# Patient Record
Sex: Female | Born: 1935 | Marital: Single | Smoking: Never smoker
Health system: Southern US, Community
[De-identification: ages and names within clinical notes are randomized; demographics above are authoritative.]

## PROBLEM LIST (undated history)

## (undated) DIAGNOSIS — E785 Hyperlipidemia, unspecified: Secondary | ICD-10-CM

## (undated) DIAGNOSIS — F0390 Unspecified dementia without behavioral disturbance: Secondary | ICD-10-CM

## (undated) DIAGNOSIS — I671 Cerebral aneurysm, nonruptured: Secondary | ICD-10-CM

---

## 1998-05-15 ENCOUNTER — Other Ambulatory Visit: Admission: RE | Admit: 1998-05-15 | Discharge: 1998-05-15 | Payer: Self-pay | Admitting: Gynecology

## 1999-06-25 ENCOUNTER — Other Ambulatory Visit: Admission: RE | Admit: 1999-06-25 | Discharge: 1999-06-25 | Payer: Self-pay | Admitting: Gynecology

## 2000-09-01 ENCOUNTER — Other Ambulatory Visit: Admission: RE | Admit: 2000-09-01 | Discharge: 2000-09-01 | Payer: Self-pay | Admitting: Gynecology

## 2015-06-28 DIAGNOSIS — I10 Essential (primary) hypertension: Secondary | ICD-10-CM | POA: Diagnosis not present

## 2015-07-04 DIAGNOSIS — Z79899 Other long term (current) drug therapy: Secondary | ICD-10-CM | POA: Diagnosis not present

## 2015-07-04 DIAGNOSIS — Z0000 Encounter for general adult medical examination without abnormal findings: Secondary | ICD-10-CM | POA: Diagnosis not present

## 2015-07-04 DIAGNOSIS — E7800 Pure hypercholesterolemia, unspecified: Secondary | ICD-10-CM | POA: Diagnosis not present

## 2016-01-08 DIAGNOSIS — Z79899 Other long term (current) drug therapy: Secondary | ICD-10-CM | POA: Diagnosis not present

## 2016-01-08 DIAGNOSIS — Z1389 Encounter for screening for other disorder: Secondary | ICD-10-CM | POA: Diagnosis not present

## 2016-01-08 DIAGNOSIS — M810 Age-related osteoporosis without current pathological fracture: Secondary | ICD-10-CM | POA: Diagnosis not present

## 2016-01-08 DIAGNOSIS — Z9181 History of falling: Secondary | ICD-10-CM | POA: Diagnosis not present

## 2016-01-08 DIAGNOSIS — E785 Hyperlipidemia, unspecified: Secondary | ICD-10-CM | POA: Diagnosis not present

## 2016-05-20 DIAGNOSIS — M25512 Pain in left shoulder: Secondary | ICD-10-CM | POA: Diagnosis not present

## 2016-05-28 DIAGNOSIS — Z0000 Encounter for general adult medical examination without abnormal findings: Secondary | ICD-10-CM | POA: Diagnosis not present

## 2016-05-28 DIAGNOSIS — M818 Other osteoporosis without current pathological fracture: Secondary | ICD-10-CM | POA: Diagnosis not present

## 2016-05-28 DIAGNOSIS — E7800 Pure hypercholesterolemia, unspecified: Secondary | ICD-10-CM | POA: Diagnosis not present

## 2016-06-10 DIAGNOSIS — Z0000 Encounter for general adult medical examination without abnormal findings: Secondary | ICD-10-CM | POA: Diagnosis not present

## 2016-06-10 DIAGNOSIS — Z23 Encounter for immunization: Secondary | ICD-10-CM | POA: Diagnosis not present

## 2016-07-09 DIAGNOSIS — M810 Age-related osteoporosis without current pathological fracture: Secondary | ICD-10-CM | POA: Diagnosis not present

## 2016-07-09 DIAGNOSIS — Z0000 Encounter for general adult medical examination without abnormal findings: Secondary | ICD-10-CM | POA: Diagnosis not present

## 2016-07-09 DIAGNOSIS — Z79899 Other long term (current) drug therapy: Secondary | ICD-10-CM | POA: Diagnosis not present

## 2016-07-09 DIAGNOSIS — R413 Other amnesia: Secondary | ICD-10-CM | POA: Diagnosis not present

## 2016-07-09 DIAGNOSIS — E785 Hyperlipidemia, unspecified: Secondary | ICD-10-CM | POA: Diagnosis not present

## 2016-07-26 DIAGNOSIS — M25532 Pain in left wrist: Secondary | ICD-10-CM | POA: Diagnosis not present

## 2016-11-28 DIAGNOSIS — L209 Atopic dermatitis, unspecified: Secondary | ICD-10-CM | POA: Diagnosis not present

## 2016-11-28 DIAGNOSIS — G3184 Mild cognitive impairment, so stated: Secondary | ICD-10-CM | POA: Diagnosis not present

## 2016-11-28 DIAGNOSIS — Z681 Body mass index (BMI) 19 or less, adult: Secondary | ICD-10-CM | POA: Diagnosis not present

## 2016-11-28 DIAGNOSIS — E785 Hyperlipidemia, unspecified: Secondary | ICD-10-CM | POA: Diagnosis not present

## 2016-11-28 DIAGNOSIS — M810 Age-related osteoporosis without current pathological fracture: Secondary | ICD-10-CM | POA: Diagnosis not present

## 2016-11-28 DIAGNOSIS — H918X9 Other specified hearing loss, unspecified ear: Secondary | ICD-10-CM | POA: Diagnosis not present

## 2016-11-28 DIAGNOSIS — R030 Elevated blood-pressure reading, without diagnosis of hypertension: Secondary | ICD-10-CM | POA: Diagnosis not present

## 2016-11-28 DIAGNOSIS — Z0000 Encounter for general adult medical examination without abnormal findings: Secondary | ICD-10-CM | POA: Diagnosis not present

## 2016-11-28 DIAGNOSIS — H9311 Tinnitus, right ear: Secondary | ICD-10-CM | POA: Diagnosis not present

## 2016-11-28 DIAGNOSIS — R69 Illness, unspecified: Secondary | ICD-10-CM | POA: Diagnosis not present

## 2017-01-08 DIAGNOSIS — Z681 Body mass index (BMI) 19 or less, adult: Secondary | ICD-10-CM | POA: Diagnosis not present

## 2017-01-08 DIAGNOSIS — M859 Disorder of bone density and structure, unspecified: Secondary | ICD-10-CM | POA: Diagnosis not present

## 2017-01-08 DIAGNOSIS — Z13820 Encounter for screening for osteoporosis: Secondary | ICD-10-CM | POA: Diagnosis not present

## 2017-01-08 DIAGNOSIS — M810 Age-related osteoporosis without current pathological fracture: Secondary | ICD-10-CM | POA: Diagnosis not present

## 2017-01-08 DIAGNOSIS — R413 Other amnesia: Secondary | ICD-10-CM | POA: Diagnosis not present

## 2017-01-08 DIAGNOSIS — E785 Hyperlipidemia, unspecified: Secondary | ICD-10-CM | POA: Diagnosis not present

## 2017-03-16 DIAGNOSIS — L299 Pruritus, unspecified: Secondary | ICD-10-CM | POA: Diagnosis not present

## 2017-03-16 DIAGNOSIS — L209 Atopic dermatitis, unspecified: Secondary | ICD-10-CM | POA: Diagnosis not present

## 2017-06-18 DIAGNOSIS — Z23 Encounter for immunization: Secondary | ICD-10-CM | POA: Diagnosis not present

## 2017-07-13 DIAGNOSIS — Z1331 Encounter for screening for depression: Secondary | ICD-10-CM | POA: Diagnosis not present

## 2017-07-13 DIAGNOSIS — Z1339 Encounter for screening examination for other mental health and behavioral disorders: Secondary | ICD-10-CM | POA: Diagnosis not present

## 2017-07-13 DIAGNOSIS — Z9181 History of falling: Secondary | ICD-10-CM | POA: Diagnosis not present

## 2017-07-13 DIAGNOSIS — R413 Other amnesia: Secondary | ICD-10-CM | POA: Diagnosis not present

## 2017-07-13 DIAGNOSIS — Z681 Body mass index (BMI) 19 or less, adult: Secondary | ICD-10-CM | POA: Diagnosis not present

## 2017-07-13 DIAGNOSIS — Z0000 Encounter for general adult medical examination without abnormal findings: Secondary | ICD-10-CM | POA: Diagnosis not present

## 2018-01-12 DIAGNOSIS — R413 Other amnesia: Secondary | ICD-10-CM | POA: Diagnosis not present

## 2018-01-12 DIAGNOSIS — Z79899 Other long term (current) drug therapy: Secondary | ICD-10-CM | POA: Diagnosis not present

## 2018-01-12 DIAGNOSIS — E785 Hyperlipidemia, unspecified: Secondary | ICD-10-CM | POA: Diagnosis not present

## 2018-01-12 DIAGNOSIS — Z681 Body mass index (BMI) 19 or less, adult: Secondary | ICD-10-CM | POA: Diagnosis not present

## 2018-02-22 DIAGNOSIS — H9192 Unspecified hearing loss, left ear: Secondary | ICD-10-CM | POA: Diagnosis not present

## 2018-02-22 DIAGNOSIS — M1990 Unspecified osteoarthritis, unspecified site: Secondary | ICD-10-CM | POA: Diagnosis not present

## 2018-02-22 DIAGNOSIS — E785 Hyperlipidemia, unspecified: Secondary | ICD-10-CM | POA: Diagnosis not present

## 2018-02-22 DIAGNOSIS — R69 Illness, unspecified: Secondary | ICD-10-CM | POA: Diagnosis not present

## 2018-02-22 DIAGNOSIS — R636 Underweight: Secondary | ICD-10-CM | POA: Diagnosis not present

## 2018-02-22 DIAGNOSIS — M810 Age-related osteoporosis without current pathological fracture: Secondary | ICD-10-CM | POA: Diagnosis not present

## 2018-02-22 DIAGNOSIS — R64 Cachexia: Secondary | ICD-10-CM | POA: Diagnosis not present

## 2018-02-22 DIAGNOSIS — Z681 Body mass index (BMI) 19 or less, adult: Secondary | ICD-10-CM | POA: Diagnosis not present

## 2018-02-22 DIAGNOSIS — Z7983 Long term (current) use of bisphosphonates: Secondary | ICD-10-CM | POA: Diagnosis not present

## 2018-05-07 DIAGNOSIS — Z681 Body mass index (BMI) 19 or less, adult: Secondary | ICD-10-CM | POA: Diagnosis not present

## 2018-05-07 DIAGNOSIS — N3000 Acute cystitis without hematuria: Secondary | ICD-10-CM | POA: Diagnosis not present

## 2018-05-10 DIAGNOSIS — Z681 Body mass index (BMI) 19 or less, adult: Secondary | ICD-10-CM | POA: Diagnosis not present

## 2018-05-10 DIAGNOSIS — R69 Illness, unspecified: Secondary | ICD-10-CM | POA: Diagnosis not present

## 2018-05-10 DIAGNOSIS — R413 Other amnesia: Secondary | ICD-10-CM | POA: Diagnosis not present

## 2018-05-13 ENCOUNTER — Emergency Department (HOSPITAL_COMMUNITY)
Admission: EM | Admit: 2018-05-13 | Discharge: 2018-05-13 | Disposition: A | Discharge status: Home / Self Care | Payer: Medicare HMO | Attending: Emergency Medicine | Admitting: Emergency Medicine

## 2018-05-13 ENCOUNTER — Encounter (HOSPITAL_COMMUNITY): Payer: Self-pay

## 2018-05-13 ENCOUNTER — Other Ambulatory Visit: Payer: Self-pay

## 2018-05-13 ENCOUNTER — Emergency Department (HOSPITAL_COMMUNITY): Payer: Medicare HMO

## 2018-05-13 DIAGNOSIS — R4182 Altered mental status, unspecified: Secondary | ICD-10-CM | POA: Diagnosis not present

## 2018-05-13 DIAGNOSIS — Z79899 Other long term (current) drug therapy: Secondary | ICD-10-CM | POA: Diagnosis not present

## 2018-05-13 DIAGNOSIS — R69 Illness, unspecified: Secondary | ICD-10-CM | POA: Diagnosis not present

## 2018-05-13 DIAGNOSIS — F0390 Unspecified dementia without behavioral disturbance: Secondary | ICD-10-CM | POA: Insufficient documentation

## 2018-05-13 DIAGNOSIS — R451 Restlessness and agitation: Secondary | ICD-10-CM | POA: Insufficient documentation

## 2018-05-13 HISTORY — DX: Hyperlipidemia, unspecified: E78.5

## 2018-05-13 HISTORY — DX: Unspecified dementia, unspecified severity, without behavioral disturbance, psychotic disturbance, mood disturbance, and anxiety: F03.90

## 2018-05-13 HISTORY — DX: Cerebral aneurysm, nonruptured: I67.1

## 2018-05-13 LAB — COMPREHENSIVE METABOLIC PANEL
ALBUMIN: 3.5 g/dL (ref 3.5–5.0)
ALT: 18 U/L (ref 0–44)
AST: 22 U/L (ref 15–41)
Alkaline Phosphatase: 50 U/L (ref 38–126)
Anion gap: 6 (ref 5–15)
BUN: 26 mg/dL — AB (ref 8–23)
CHLORIDE: 109 mmol/L (ref 98–111)
CO2: 28 mmol/L (ref 22–32)
CREATININE: 0.61 mg/dL (ref 0.44–1.00)
Calcium: 9.1 mg/dL (ref 8.9–10.3)
GFR calc Af Amer: >60 mL/min (ref >60)
GFR calc non Af Amer: >60 mL/min (ref >60)
Glucose, Bld: 102 mg/dL — ABNORMAL HIGH (ref 70–99)
Potassium: 3.6 mmol/L (ref 3.5–5.1)
SODIUM: 143 mmol/L (ref 135–145)
Total Bilirubin: 0.7 mg/dL (ref 0.3–1.2)
Total Protein: 6.5 g/dL (ref 6.5–8.1)

## 2018-05-13 LAB — CBC
HCT: 39 % (ref 36.0–46.0)
Hemoglobin: 12.4 g/dL (ref 12.0–15.0)
MCH: 30.5 pg (ref 26.0–34.0)
MCHC: 31.8 g/dL (ref 30.0–36.0)
MCV: 95.8 fL (ref 78.0–100.0)
PLATELETS: 227 10*3/uL (ref 150–400)
RBC: 4.07 MIL/uL (ref 3.87–5.11)
RDW: 14 % (ref 11.5–15.5)
WBC: 8.4 10*3/uL (ref 4.0–10.5)

## 2018-05-13 MED ORDER — LORAZEPAM 1 MG PO TABS
0.5000 mg | ORAL_TABLET | Freq: Two times a day (BID) | ORAL | Status: DC | PRN
  Administered 2018-05-13: 0.5 mg via ORAL
  Filled 2018-05-13: qty 1

## 2018-05-13 MED ORDER — MEMANTINE HCL-DONEPEZIL HCL ER 28-10 MG PO CP24: 1.0000 | ORAL_CAPSULE | Freq: Every day | ORAL | Status: DC

## 2018-05-13 MED ORDER — LORAZEPAM 0.5 MG PO TABS: 0.5000 mg | ORAL_TABLET | Freq: Two times a day (BID) | ORAL | 0 refills | Status: AC | PRN

## 2018-05-13 MED ORDER — DIVALPROEX SODIUM 125 MG PO DR TAB: 125.0000 mg | DELAYED_RELEASE_TABLET | Freq: Two times a day (BID) | ORAL | 0 refills | Status: AC

## 2018-05-13 MED ORDER — DIVALPROEX SODIUM 125 MG PO DR TAB
125.0000 mg | DELAYED_RELEASE_TABLET | Freq: Two times a day (BID) | ORAL | Status: DC
  Administered 2018-05-13: 125 mg via ORAL
  Filled 2018-05-13 (×2): qty 1

## 2018-05-13 NOTE — ED Triage Notes (Signed)
Pt here with family with hx of dementia. Pt has been running away from home by climbing out of the window the past few days. Pt was found by husband 2 miles from home. Pt was recently treated for UTI and placed on risperidone but it has not been helped. Pt has bruise to left forearm but no other signs of injury. Pt appears mildly agitated but is oriented to self and time. Pt states "they keep me couped up all the time and I want to get out and go other places" VSS.

## 2018-05-13 NOTE — ED Provider Notes (Addendum)
[facility] EMERGENCY DEPARTMENT Provider Note   CSN: [id number] Arrival date & time: 05/13/18  1001     History   Chief Complaint Chief Complaint  Patient presents with  . Psychiatric Evaluation  . Dementia    HPI Elizabeth Richard is a 83 y.o. female.  HPI Patient with dementia, level 5 caveat. Patient is here with her son who provides much of the HPI. Acknowledge a history of dementia, pretty unremarkable course until about 5 days ago. Now for the past 5 days she has been agitated, trying to leave the house, uncooperative, behaving and almost manic-like manner, which is unusual for her. She does not have a history of psychosis or psychiatric disease. No recent medication change, diet change until 2 days ago, when her physician prescribed Risperdal. It is unclear if she has been taking this medication. Patient herself denies physical complaints, states that she does not know why she is here in the emergency department.  Past Medical History:  Diagnosis Date  . Brain aneurysm   . Dementia   . Hyperlipidemia     There are no active problems to display for this patient.   History reviewed. No pertinent surgical history.   OB History   None      Home Medications    Prior to Admission medications   Medication Sig Start Date End Date Taking? Authorizing Provider  alendronate (FOSAMAX) 70 MG tablet Take 70 mg by mouth once a week. [facility] 02/20/18  Yes [provider]  NAMZARIC 28-10 MG CP24 Take 1 capsule by mouth daily. 05/05/18  Yes [provider]  naproxen (NAPROSYN) 375 MG tablet Take 375 mg by mouth daily.   Yes [provider]  pravastatin (PRAVACHOL) 40 MG tablet Take 40 mg by mouth daily. 02/20/18  Yes [provider]  nitrofurantoin, macrocrystal-monohydrate, (MACROBID) 100 MG capsule Take 100 mg by mouth 2 (two) times daily. 05/07/18   [provider]    Family History History reviewed. No  pertinent family history.  Social History Social History   Tobacco Use  . Smoking status: Never Smoker  Substance Use Topics  . Alcohol use: Never    Frequency: Never  . Drug use: Never     Allergies   Patient has no known allergies.   Review of Systems Review of Systems  Unable to perform ROS: Dementia     Physical Exam Updated Vital Signs BP (!) 154/62 (BP Location: Right Arm)   Pulse 77   Temp 99 F (37.2 C) (Oral)   Resp 16   Ht 5\' 6"  (1.676 m)   Wt 49.9 kg   SpO2 100%   BMI 17.75 kg/m   Physical Exam  Constitutional: She is oriented to person, place, and time. She appears well-developed and well-nourished. No distress.  HENT:  Head: Normocephalic and atraumatic.  Eyes: Conjunctivae and EOM are normal.  Cardiovascular: Normal rate and regular rhythm.  Pulmonary/Chest: Effort normal and breath sounds normal. No stridor. No respiratory distress.  Abdominal: She exhibits no distension.  Musculoskeletal: She exhibits no edema.  Neurological: She is alert and oriented to person, place, and time. No cranial nerve deficit.  Skin: Skin is warm and dry.  Anterior right forearm unremarkable  Psychiatric: Her mood appears anxious. Her speech is tangential. Thought content is delusional. Cognition and memory are impaired.  Nursing note and vitals reviewed.    ED Treatments / Results  Labs (all labs ordered are listed, but only abnormal results are displayed)  Labs Reviewed  COMPREHENSIVE METABOLIC PANEL - Abnormal; Notable for the following components:      Result Value   Glucose, Bld 102 (*)    BUN 26 (*)    All other components within normal limits  CBC  URINALYSIS, ROUTINE W REFLEX MICROSCOPIC    EKG None  Radiology Ct Head Wo Contrast  Result Date: 05/13/2018 CLINICAL DATA:  Altered mental status. EXAM: CT HEAD WITHOUT CONTRAST TECHNIQUE: Contiguous axial images were obtained from the base of the skull through the vertex without intravenous  contrast. COMPARISON:  None. FINDINGS: Brain: Mild chronic ischemic white matter disease is noted. No mass effect or midline shift is noted. Ventricular size is within normal limits. There is no evidence of mass lesion, hemorrhage or acute infarction. Vascular: No hyperdense vessel or unexpected calcification. Skull: Normal. Negative for fracture or focal lesion. Sinuses/Orbits: No acute finding. Other: None. IMPRESSION: Mild chronic ischemic white matter disease. No acute intracranial abnormality seen. Electronically Signed   By: Lupita RaiderJames  Green Jr, M.D.   On: 05/13/2018 16:14    Procedures Procedures (including critical care time)  Medications Ordered in ED Medications  divalproex (DEPAKOTE) DR tablet 125 mg (125 mg Oral Given 05/13/18 1614)     Initial Impression / Assessment and Plan / ED Course  I have reviewed the triage vital signs and the nursing notes.  Pertinent labs & imaging results that were available during my care of the patient were reviewed by me and considered in my medical decision making (see chart for details).     5:27 PM On repeat exam the patient is calm.  Not she has started Depakote dosing.  I discussed patient's case with our behavioral health assessment team With notable change in her consciousness over 4 or 5 days, there is some consideration for geriatric psychiatric etiology. Evaluation here including CT scan, labs generally reassuring, no evidence for acute new medical condition. Given the change in behavior, the patient will continue Depakote dosing regimen, as well as PRN Ativan for additional agitation. Patient is awaiting psychiatric placement. Social work is also aware of the patient and family's situation. I discussed findings thus far with the patient's son, and the patient's husband. They voiced understanding, acknowledge need for mood stabilization, possible placement, though understand that the patient may indeed be discharged home with home health  services as well, if she improves and placement is not found.  Patient will transfer into the ED Pod F for placement.  6:24 PM Family has elected to take the patient home.  They understand return precautions and need to f/u w PMD.  Home Health Services have been facilitated as well. Final Clinical Impressions(s) / ED Diagnoses  Agitation   Gerhard MunchLockwood, Kastin Cerda, MD 05/13/18 1729    Gerhard MunchLockwood, Nylani Michetti, MD 05/13/18 [phone]

## 2018-05-13 NOTE — Progress Notes (Signed)
Consult request has been received. CSW attempting to follow up at present time.  CSW notes a TTS consult is in place and is  CSW awaiting results of the TTS assessment.  CSW will continue to follow for D/C needs.  Dorothe PeaJonathan F. Justyn Langham, LCSW, LCAS, CSI Clinical Social Worker Ph: [phone]

## 2018-05-13 NOTE — Discharge Instructions (Signed)
Please be sure to monitor her condition carefully, and do not hesitate to return here. Otherwise be sure to follow-up with your primary care physician.  In the coming days the home health services will contact you for an assessment and to initiate services. Additional services may be facilitated through your primary care office.  Return here for concerning changes in her condition.

## 2018-05-13 NOTE — ED Notes (Signed)
Pt alert and oriented in NAD. Pt/caregivers verbalized understanding of discharge instructions.

## 2018-05-13 NOTE — Progress Notes (Signed)
CSW responded to psychiatric team's request to seek pt's preference for pt's [place], but pt had D/C'd.  Please reconsult if future social work needs arise.  CSW signing off, as social work intervention is no longer needed.  Elizabeth PeaJonathan F. Mayci Haning, LCSW, LCAS, CSI Clinical Social Worker Ph: [phone]

## 2018-05-13 NOTE — ED Notes (Signed)
Scrubs not required per Dr Jeraldine LootsLockwood

## 2018-05-21 DIAGNOSIS — Z681 Body mass index (BMI) 19 or less, adult: Secondary | ICD-10-CM | POA: Diagnosis not present

## 2018-05-21 DIAGNOSIS — R413 Other amnesia: Secondary | ICD-10-CM | POA: Diagnosis not present

## 2018-05-21 DIAGNOSIS — R69 Illness, unspecified: Secondary | ICD-10-CM | POA: Diagnosis not present

## 2018-05-21 DIAGNOSIS — M2550 Pain in unspecified joint: Secondary | ICD-10-CM | POA: Diagnosis not present

## 2018-05-28 DIAGNOSIS — Z022 Encounter for examination for admission to residential institution: Secondary | ICD-10-CM | POA: Diagnosis not present

## 2018-06-01 DIAGNOSIS — Z23 Encounter for immunization: Secondary | ICD-10-CM | POA: Diagnosis not present

## 2018-06-21 DIAGNOSIS — R69 Illness, unspecified: Secondary | ICD-10-CM | POA: Diagnosis not present

## 2018-06-21 DIAGNOSIS — R636 Underweight: Secondary | ICD-10-CM | POA: Diagnosis not present

## 2018-06-21 DIAGNOSIS — R413 Other amnesia: Secondary | ICD-10-CM | POA: Diagnosis not present

## 2018-06-21 DIAGNOSIS — Z681 Body mass index (BMI) 19 or less, adult: Secondary | ICD-10-CM | POA: Diagnosis not present

## 2018-07-11 DIAGNOSIS — R350 Frequency of micturition: Secondary | ICD-10-CM | POA: Diagnosis not present

## 2020-07-23 IMAGING — CT CT HEAD W/O CM
4 series · 16 of 47 positions shown, 18 images · non-contrast
Comparison: None.

CLINICAL DATA: Altered mental status.

EXAM:
CT HEAD WITHOUT CONTRAST
TECHNIQUE: Contiguous axial images were obtained from the base of the skull
through the vertex without intravenous contrast.

[Series 3: head without · axial · non-contrast · 0.41mm/px · z∈[-137,-17]mm · 7 of 32 slices shown, 9 images]
[im 4/32  brain]
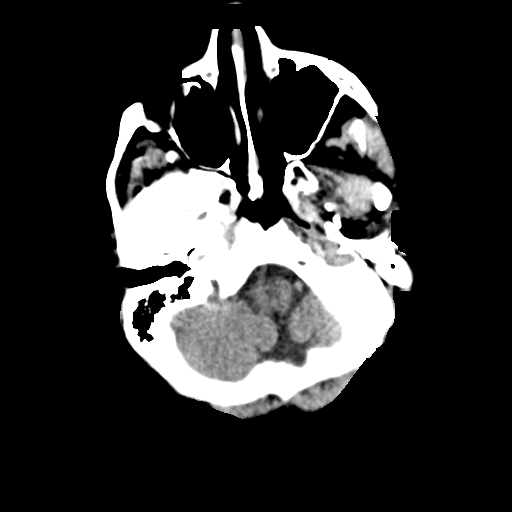
[im 4/32  bone]
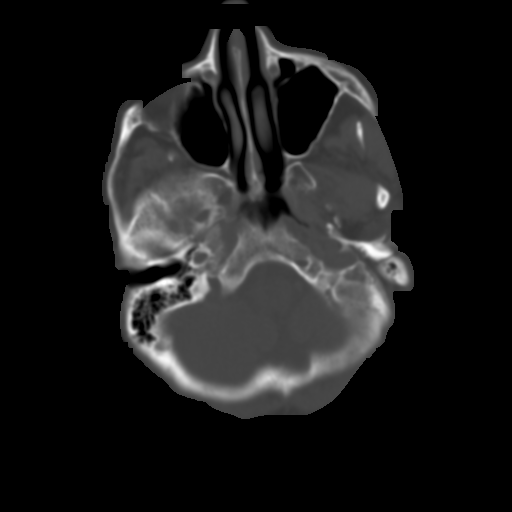
[im 8/32  brain]
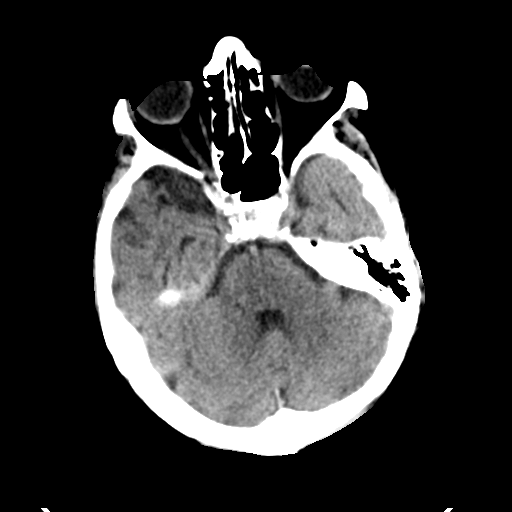
[im 12/32  brain]
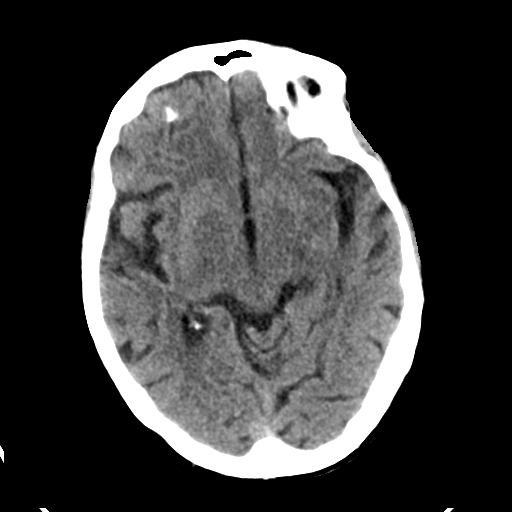
[im 16/32  brain]
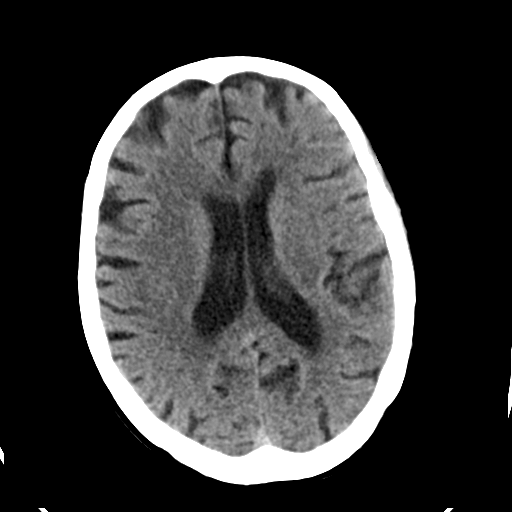
[im 20/32  brain]
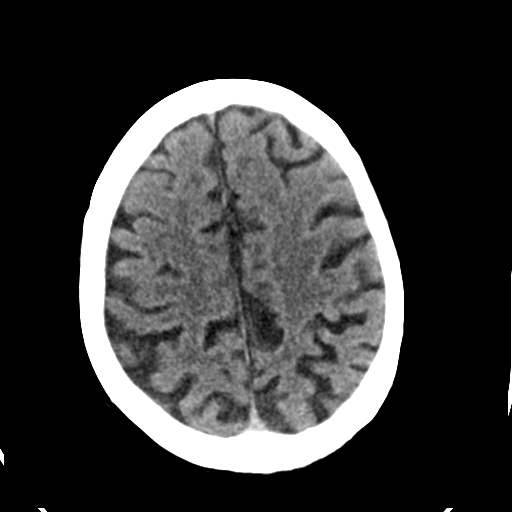
[im 20/32  bone]
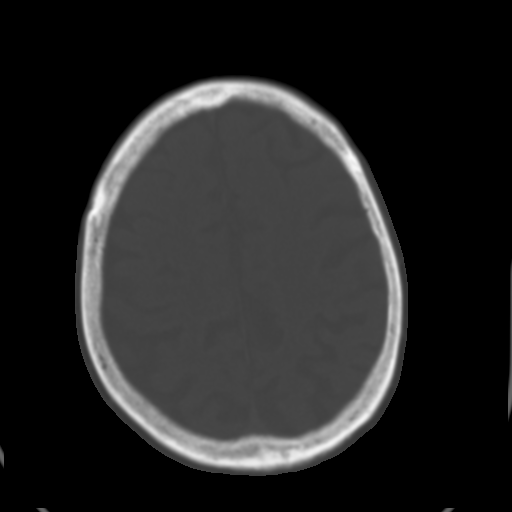
[im 24/32  brain]
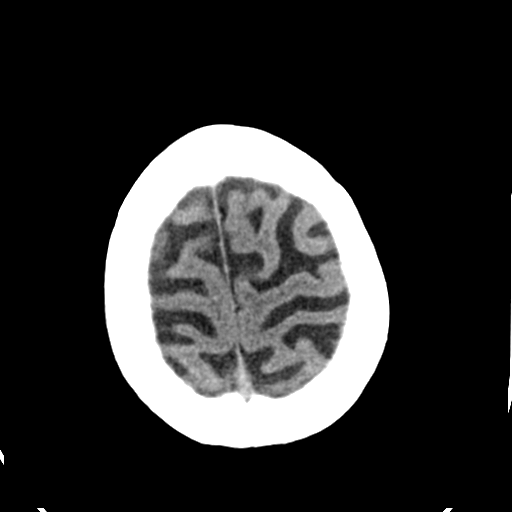
[im 28/32  brain]
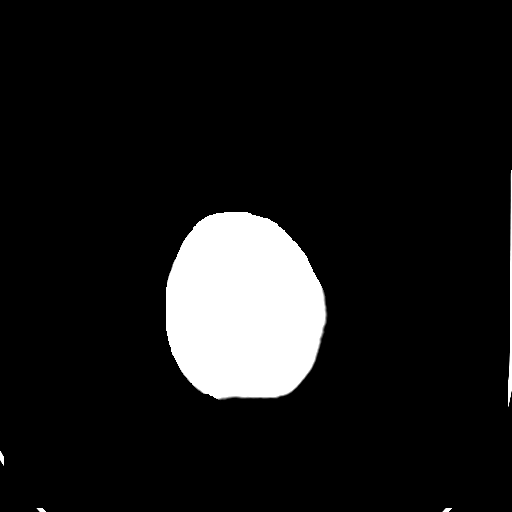

[Series 4: head bone · axial · 0.41mm/px · z∈[-138,-106]mm · 3 of 78 slices shown]
[im 8/78  bone]
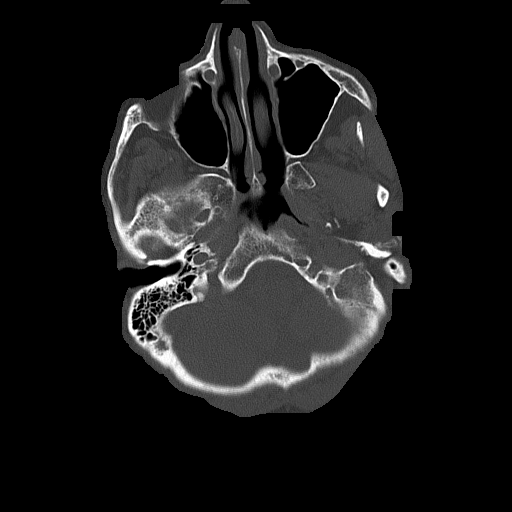
[im 16/78  bone]
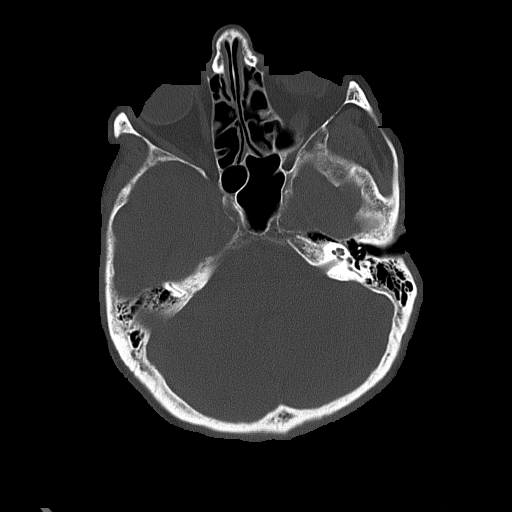
[im 24/78  bone]
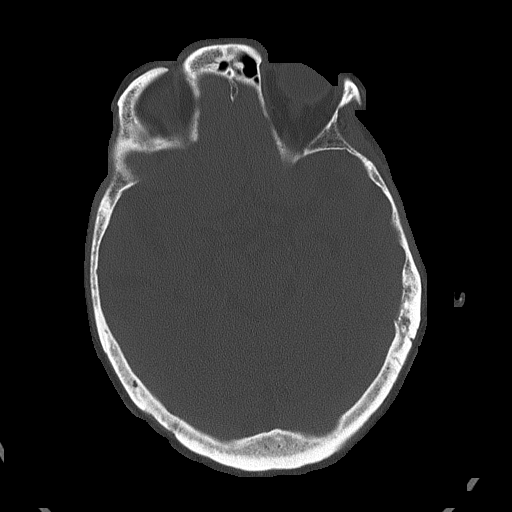

[Series 5: head without cor · coronal · non-contrast · 0.29mm/px · 3 of 67 slices shown]
[im 23/67  brain]
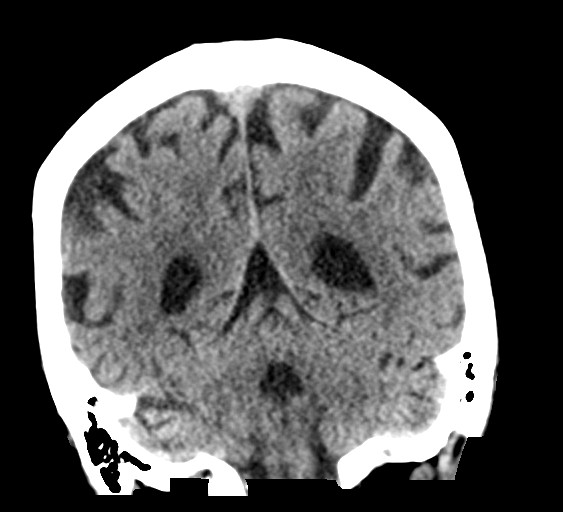
[im 30/67  brain]
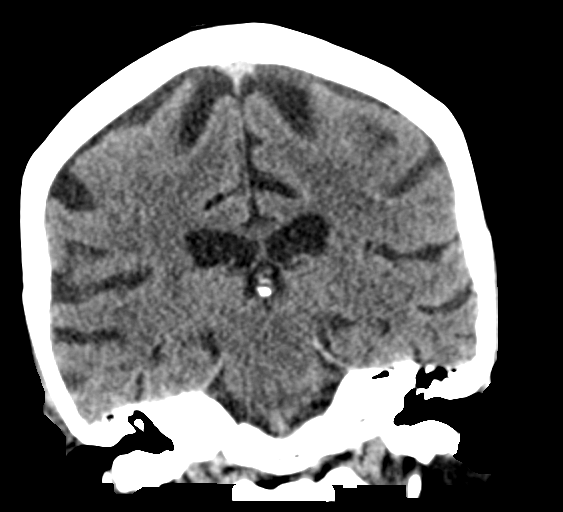
[im 37/67  brain]
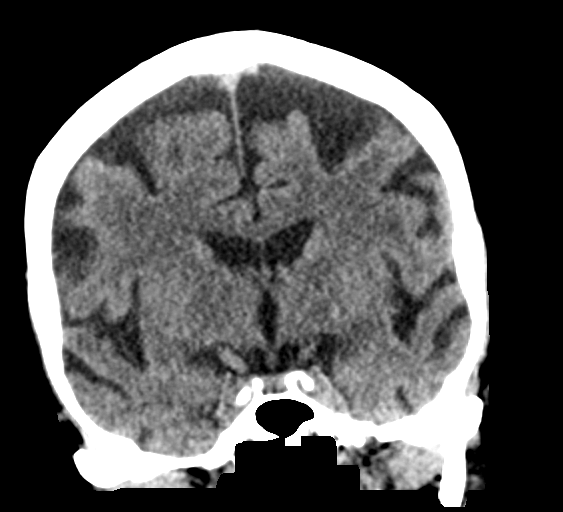

[Series 6: head without sag · sagittal · non-contrast · 0.30mm/px · 3 of 55 slices shown]
[im 21/55  brain]
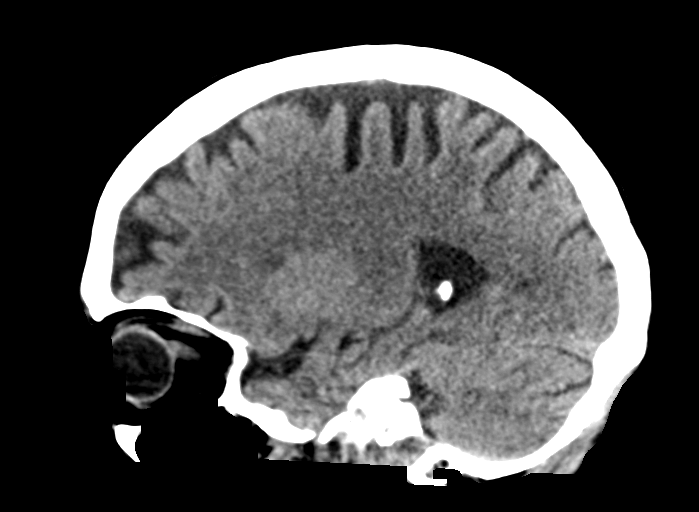
[im 28/55  brain]
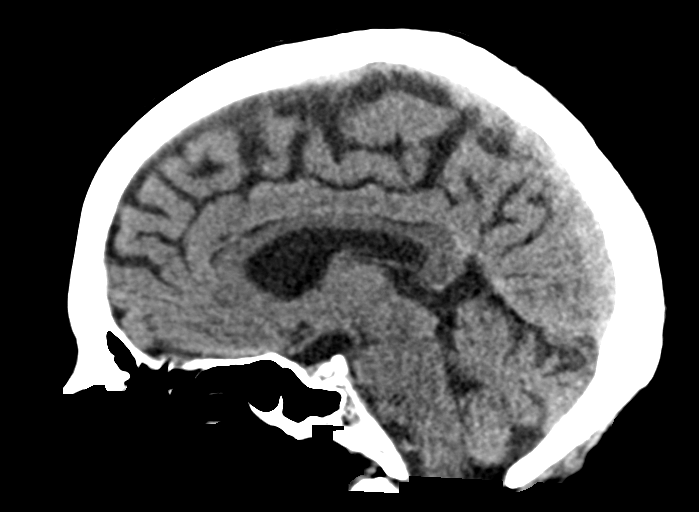
[im 34/55  brain]
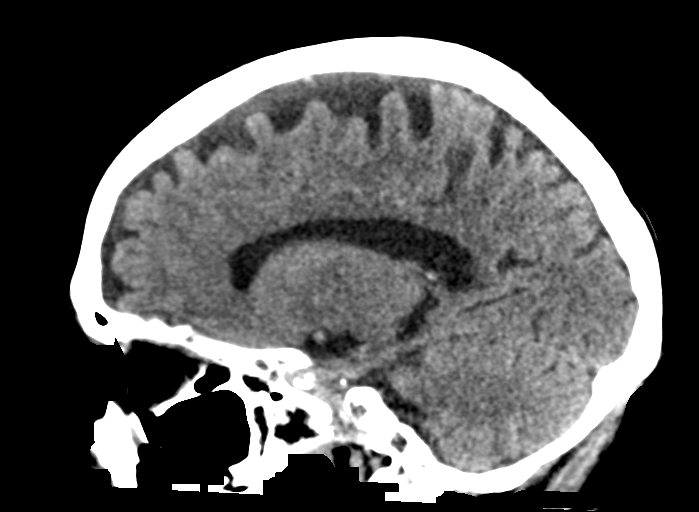

[16 of 47 positions shown; findings below may reference images not displayed]

FINDINGS: Brain: Mild chronic ischemic white matter disease is noted. No mass
effect or midline shift is noted. Ventricular size is within normal
limits. There is no evidence of mass lesion, hemorrhage or acute
infarction.

Vascular: No hyperdense vessel or unexpected calcification.

Skull: Normal. Negative for fracture or focal lesion.

Sinuses/Orbits: No acute finding.

Other: None.
IMPRESSION: Mild chronic ischemic white matter disease. No acute intracranial
abnormality seen.
